# Patient Record
Sex: Male | Born: 1955 | Race: White | Hispanic: No | Marital: Married
Health system: Southern US, Community
[De-identification: ages and names within clinical notes are randomized; demographics above are authoritative.]

---

## 1998-09-17 ENCOUNTER — Emergency Department (HOSPITAL_COMMUNITY): Admission: EM | Admit: 1998-09-17 | Discharge: 1998-09-17 | Payer: Self-pay | Admitting: Emergency Medicine

## 2000-10-09 ENCOUNTER — Emergency Department (HOSPITAL_COMMUNITY): Admission: EM | Admit: 2000-10-09 | Discharge: 2000-10-09 | Payer: Self-pay | Admitting: Emergency Medicine

## 2006-08-25 ENCOUNTER — Ambulatory Visit: Payer: Self-pay | Admitting: Internal Medicine

## 2007-06-24 DIAGNOSIS — F411 Generalized anxiety disorder: Secondary | ICD-10-CM | POA: Insufficient documentation

## 2007-06-24 DIAGNOSIS — K625 Hemorrhage of anus and rectum: Secondary | ICD-10-CM | POA: Insufficient documentation

## 2007-06-24 DIAGNOSIS — I10 Essential (primary) hypertension: Secondary | ICD-10-CM | POA: Insufficient documentation

## 2007-06-24 DIAGNOSIS — K219 Gastro-esophageal reflux disease without esophagitis: Secondary | ICD-10-CM | POA: Insufficient documentation

## 2007-07-26 ENCOUNTER — Emergency Department (HOSPITAL_COMMUNITY): Admission: EM | Admit: 2007-07-26 | Discharge: 2007-07-26 | Payer: Self-pay | Admitting: Emergency Medicine

## 2007-07-27 ENCOUNTER — Emergency Department (HOSPITAL_COMMUNITY): Admission: EM | Admit: 2007-07-27 | Discharge: 2007-07-27 | Payer: Self-pay | Admitting: Emergency Medicine

## 2007-08-13 ENCOUNTER — Emergency Department (HOSPITAL_COMMUNITY): Admission: EM | Admit: 2007-08-13 | Discharge: 2007-08-14 | Payer: Self-pay | Admitting: Emergency Medicine

## 2010-07-07 NOTE — Assessment & Plan Note (Signed)
[facility]                         GASTROENTEROLOGY OFFICE NOTE   NAME:Brendan Herrera, Brendan Herrera                     MRN:          [id number]  DATE:08/25/2006                            DOB:          09-02-1955    CHIEF COMPLAINT:  Heme positive stool.   REQUESTING PHYSICIAN:  Mila Homer   ASSESSMENT:  1. This is a 55 year old white man with occasional rectal bleeding      that sounds like it could be anorectal. He also has heme positive      stool. The possibility of colorectal neoplasia exists and I      specifically informed the patient that colon cancer could cause      these problems.  2. Excessive alcohol use, question alcoholism.  3. Some recent heartburn and indigestion question related to alcohol,      aspirin, other problems, or just gastroesophageal reflux disease.      He is on Tums, but does not wish to have other treatment.   RECOMMENDATIONS AND PLAN:  1. I recommended a colonoscopy as Dr. Perrin Maltese has. The patient was      originally set up for a direct schedule, but he wanted to meet me      first. He still wants to think about this and he understands the      possibility that he could be harboring a colon cancer and that      diagnosing it sooner rather than later would be beneficial.  2. I cautioned him about his alcohol intake. I think that he does      drink too much on a regular basis and this puts him at risk of      liver disease. He is not inclined to curtail his alcohol at this      time.  3. I also thought that an upper GI endoscopy could be useful to      evaluate his symptoms and his heme positive, but he declined this      as well.   Please see the medical history and physical form for further details  about this patient's visit today. Physical examination was unrevealing  overall. There was no stigmata of chronic liver disease noted.   I have reviewed the office notes from Dr. Perrin Maltese.   Note that his other problems  and conditions include: Anxiety and panic  disorder; hypertension. He had a traumatic injury to the right upper  extremity as a child with fracture and surgical repair. He has had  spastic torticollis problems.     Iva Boop, MD,FACG  Electronically Signed   CEG/MedQ  DD: 08/25/2006  DT: 08/26/2006  Job #: [id number]   cc:   Jonita Albee, M.D.

## 2010-11-19 LAB — POCT I-STAT, CHEM 8
Calcium, Ion: 1.12
Creatinine, Ser: 1
Hemoglobin: 17.3 — ABNORMAL HIGH
Sodium: 137
TCO2: 26

## 2010-11-19 LAB — DIFFERENTIAL
Basophils Absolute: 0.1
Basophils Absolute: 0
Basophils Relative: 1
Basophils Relative: 0
Eosinophils Absolute: 0.3
Eosinophils Absolute: 0.3
Eosinophils Relative: 3
Eosinophils Relative: 4
Eosinophils Relative: 3
Lymphocytes Relative: 15
Lymphocytes Relative: 20
Lymphocytes Relative: 12
Lymphs Abs: 1.4
Lymphs Abs: 1.5
Lymphs Abs: 1.3
Monocytes Absolute: 0.6
Monocytes Absolute: 0.7
Monocytes Absolute: 0.7
Monocytes Relative: 9
Monocytes Relative: 7
Neutro Abs: 5
Neutro Abs: 6.9
Neutro Abs: 8.4 — ABNORMAL HIGH
Neutrophils Relative %: 66
Neutrophils Relative %: 78 — ABNORMAL HIGH

## 2010-11-19 LAB — RAPID URINE DRUG SCREEN, HOSP PERFORMED
Amphetamines: NOT DETECTED
Barbiturates: NOT DETECTED
Benzodiazepines: NOT DETECTED
Cocaine: NOT DETECTED
Opiates: NOT DETECTED
Opiates: NOT DETECTED
Tetrahydrocannabinol: NOT DETECTED
Tetrahydrocannabinol: NOT DETECTED

## 2010-11-19 LAB — CBC
HCT: 46.9
HCT: 48.6
HCT: 45.7
Hemoglobin: 16.1
Hemoglobin: 16.5
Hemoglobin: 15.5
MCHC: 34.3
MCHC: 33.9
MCV: 93
MCV: 93.2
Platelets: 322
Platelets: 306
RBC: 5.05
RBC: 5.23
RBC: 4.9
RDW: 12.7
RDW: 12.8
WBC: 7.5
WBC: 9.2
WBC: 10.7 — ABNORMAL HIGH

## 2010-11-19 LAB — ETHANOL
Alcohol, Ethyl (B): <5
Alcohol, Ethyl (B): <5
Alcohol, Ethyl (B): <5

## 2010-11-19 LAB — BASIC METABOLIC PANEL
GFR calc Af Amer: >60
GFR calc non Af Amer: >60
Potassium: 4.1
Sodium: 139

## 2012-07-14 ENCOUNTER — Ambulatory Visit: Payer: Self-pay | Admitting: Family Medicine

## 2013-08-06 ENCOUNTER — Inpatient Hospital Stay: Payer: Self-pay | Admitting: Internal Medicine

## 2013-08-06 DIAGNOSIS — I6350 Cerebral infarction due to unspecified occlusion or stenosis of unspecified cerebral artery: Secondary | ICD-10-CM

## 2013-08-06 DIAGNOSIS — A419 Sepsis, unspecified organism: Secondary | ICD-10-CM

## 2013-08-06 LAB — COMPREHENSIVE METABOLIC PANEL
ALBUMIN: 2.8 g/dL — AB (ref 3.4–5.0)
ALK PHOS: 79 U/L
ALT: 33 U/L (ref 12–78)
Anion Gap: 11 (ref 7–16)
BILIRUBIN TOTAL: 0.7 mg/dL (ref 0.2–1.0)
BUN: 9 mg/dL (ref 7–18)
CALCIUM: 8.7 mg/dL (ref 8.5–10.1)
CHLORIDE: 99 mmol/L (ref 98–107)
CREATININE: 0.77 mg/dL (ref 0.60–1.30)
Co2: 23 mmol/L (ref 21–32)
EGFR (Non-African Amer.): >60
Glucose: 129 mg/dL — ABNORMAL HIGH (ref 65–99)
OSMOLALITY: 267 (ref 275–301)
POTASSIUM: 4.2 mmol/L (ref 3.5–5.1)
SGOT(AST): 32 U/L (ref 15–37)
SODIUM: 133 mmol/L — AB (ref 136–145)
Total Protein: 7.1 g/dL (ref 6.4–8.2)

## 2013-08-06 LAB — CBC WITH DIFFERENTIAL/PLATELET
BASOS ABS: 0.1 10*3/uL (ref 0.0–0.1)
BASOS PCT: 0.4 %
EOS ABS: 0.1 10*3/uL (ref 0.0–0.7)
EOS PCT: 0.5 %
HCT: 38.4 % — ABNORMAL LOW (ref 40.0–52.0)
HGB: 12.7 g/dL — AB (ref 13.0–18.0)
Lymphocyte #: 0.7 10*3/uL — ABNORMAL LOW (ref 1.0–3.6)
Lymphocyte %: 3.3 %
MCH: 28.3 pg (ref 26.0–34.0)
MCHC: 33.2 g/dL (ref 32.0–36.0)
MCV: 85 fL (ref 80–100)
MONO ABS: 1.4 x10 3/mm — AB (ref 0.2–1.0)
Monocyte %: 6.8 %
NEUTROS ABS: 18.6 10*3/uL — AB (ref 1.4–6.5)
Neutrophil %: 89 %
Platelet: 359 10*3/uL (ref 150–440)
RBC: 4.51 10*6/uL (ref 4.40–5.90)
RDW: 14.4 % (ref 11.5–14.5)
WBC: 20.9 10*3/uL — AB (ref 3.8–10.6)

## 2013-08-06 LAB — TROPONIN I
TROPONIN-I: 0.06 ng/mL — AB
TROPONIN-I: 0.05 ng/mL
Troponin-I: 0.04 ng/mL

## 2013-08-06 LAB — URINALYSIS, COMPLETE
BACTERIA: NONE SEEN
BILIRUBIN, UR: NEGATIVE
Glucose,UR: NEGATIVE mg/dL (ref 0–75)
Ketone: NEGATIVE
NITRITE: NEGATIVE
Ph: 5 (ref 4.5–8.0)
RBC,UR: 3 /HPF (ref 0–5)
Specific Gravity: 1.01 (ref 1.003–1.030)
Squamous Epithelial: 1
WBC UR: 53 /HPF (ref 0–5)

## 2013-08-06 LAB — CK TOTAL AND CKMB (NOT AT ARMC)
CK, TOTAL: 24 U/L — AB
CK, TOTAL: 21 U/L — AB
CK, Total: 96 U/L
CK-MB: <0.5 ng/mL — ABNORMAL LOW (ref 0.5–3.6)
CK-MB: <0.5 ng/mL — ABNORMAL LOW (ref 0.5–3.6)

## 2013-08-07 ENCOUNTER — Ambulatory Visit: Payer: Self-pay | Admitting: Neurology

## 2013-08-07 DIAGNOSIS — I059 Rheumatic mitral valve disease, unspecified: Secondary | ICD-10-CM

## 2013-08-07 LAB — LIPID PANEL
CHOLESTEROL: 90 mg/dL (ref 0–200)
HDL Cholesterol: 26 mg/dL — ABNORMAL LOW (ref 40–60)
Ldl Cholesterol, Calc: 53 mg/dL (ref 0–100)
Triglycerides: 53 mg/dL (ref 0–200)
VLDL CHOLESTEROL, CALC: 11 mg/dL (ref 5–40)

## 2013-08-07 LAB — MAGNESIUM: Magnesium: 2 mg/dL

## 2013-08-07 LAB — TROPONIN I: TROPONIN-I: 0.02 ng/mL

## 2013-08-07 LAB — CK-MB

## 2013-08-08 DIAGNOSIS — A419 Sepsis, unspecified organism: Secondary | ICD-10-CM

## 2013-08-08 DIAGNOSIS — I6340 Cerebral infarction due to embolism of unspecified cerebral artery: Secondary | ICD-10-CM

## 2013-08-08 LAB — CBC WITH DIFFERENTIAL/PLATELET
BASOS PCT: 0.6 %
Basophil #: 0.1 10*3/uL (ref 0.0–0.1)
EOS ABS: 0.2 10*3/uL (ref 0.0–0.7)
Eosinophil %: 1.4 %
HCT: 34.2 % — AB (ref 40.0–52.0)
HGB: 11.1 g/dL — ABNORMAL LOW (ref 13.0–18.0)
Lymphocyte #: 1.4 10*3/uL (ref 1.0–3.6)
Lymphocyte %: 9.6 %
MCH: 27.9 pg (ref 26.0–34.0)
MCHC: 32.5 g/dL (ref 32.0–36.0)
MCV: 86 fL (ref 80–100)
MONO ABS: 1.3 x10 3/mm — AB (ref 0.2–1.0)
MONOS PCT: 9.1 %
Neutrophil #: 11.3 10*3/uL — ABNORMAL HIGH (ref 1.4–6.5)
Neutrophil %: 79.3 %
Platelet: 356 10*3/uL (ref 150–440)
RBC: 3.98 10*6/uL — AB (ref 4.40–5.90)
RDW: 14.2 % (ref 11.5–14.5)
WBC: 14.3 10*3/uL — AB (ref 3.8–10.6)

## 2013-08-08 LAB — CK-MB: CK-MB: <0.5 ng/mL — ABNORMAL LOW (ref 0.5–3.6)

## 2013-08-08 LAB — COMPREHENSIVE METABOLIC PANEL
ALT: 27 U/L (ref 12–78)
AST: 20 U/L (ref 15–37)
Albumin: 2.3 g/dL — ABNORMAL LOW (ref 3.4–5.0)
Alkaline Phosphatase: 65 U/L
Anion Gap: 8 (ref 7–16)
BUN: 6 mg/dL — AB (ref 7–18)
Bilirubin,Total: 0.4 mg/dL (ref 0.2–1.0)
CALCIUM: 7.9 mg/dL — AB (ref 8.5–10.1)
CHLORIDE: 106 mmol/L (ref 98–107)
CO2: 25 mmol/L (ref 21–32)
CREATININE: 0.83 mg/dL (ref 0.60–1.30)
Glucose: 94 mg/dL (ref 65–99)
Osmolality: 275 (ref 275–301)
POTASSIUM: 3.4 mmol/L — AB (ref 3.5–5.1)
Sodium: 139 mmol/L (ref 136–145)
TOTAL PROTEIN: 6.2 g/dL — AB (ref 6.4–8.2)

## 2013-08-08 LAB — TROPONIN I: Troponin-I: <0.02 ng/mL

## 2013-08-08 LAB — PSA: PSA: 2.3 ng/mL (ref 0.0–4.0)

## 2013-08-08 LAB — VANCOMYCIN, TROUGH: Vancomycin, Trough: 13 ug/mL (ref 10–20)

## 2013-08-09 DIAGNOSIS — I059 Rheumatic mitral valve disease, unspecified: Secondary | ICD-10-CM

## 2013-08-09 LAB — LIPID PANEL
CHOLESTEROL: 78 mg/dL (ref 0–200)
HDL: 23 mg/dL — AB (ref 40–60)
LDL CHOLESTEROL, CALC: 42 mg/dL (ref 0–100)
TRIGLYCERIDES: 66 mg/dL (ref 0–200)
VLDL CHOLESTEROL, CALC: 13 mg/dL (ref 5–40)

## 2013-08-09 LAB — VANCOMYCIN, TROUGH: VANCOMYCIN, TROUGH: 22 ug/mL — AB (ref 10–20)

## 2013-08-10 LAB — GENTAMICIN LEVEL, TROUGH: Gentamicin, Trough: 0.9 ug/mL (ref 0.0–2.0)

## 2013-08-10 LAB — GENTAMICIN LEVEL, PEAK: Gentamicin, Peak: 2.3 ug/mL — ABNORMAL LOW (ref 4.0–8.0)

## 2013-08-11 LAB — GENTAMICIN LEVEL, TROUGH: GENTAMICIN, TROUGH: 0.8 ug/mL (ref 0.0–2.0)

## 2013-08-11 LAB — CBC WITH DIFFERENTIAL/PLATELET
BASOS PCT: 0.6 %
Basophil #: 0.1 10*3/uL (ref 0.0–0.1)
Eosinophil #: 0.4 10*3/uL (ref 0.0–0.7)
Eosinophil %: 3.7 %
HCT: 32.8 % — ABNORMAL LOW (ref 40.0–52.0)
HGB: 10.6 g/dL — ABNORMAL LOW (ref 13.0–18.0)
Lymphocyte #: 1.4 10*3/uL (ref 1.0–3.6)
Lymphocyte %: 11.2 %
MCH: 27.3 pg (ref 26.0–34.0)
MCHC: 32.1 g/dL (ref 32.0–36.0)
MCV: 85 fL (ref 80–100)
MONO ABS: 0.9 x10 3/mm (ref 0.2–1.0)
MONOS PCT: 7.4 %
NEUTROS ABS: 9.4 10*3/uL — AB (ref 1.4–6.5)
NEUTROS PCT: 77.1 %
PLATELETS: 431 10*3/uL (ref 150–440)
RBC: 3.86 10*6/uL — AB (ref 4.40–5.90)
RDW: 14.2 % (ref 11.5–14.5)
WBC: 12.1 10*3/uL — ABNORMAL HIGH (ref 3.8–10.6)

## 2013-08-11 LAB — CULTURE, BLOOD (SINGLE)

## 2013-08-11 LAB — BASIC METABOLIC PANEL
Anion Gap: 7 (ref 7–16)
BUN: 7 mg/dL (ref 7–18)
CALCIUM: 8.5 mg/dL (ref 8.5–10.1)
CHLORIDE: 105 mmol/L (ref 98–107)
CO2: 26 mmol/L (ref 21–32)
CREATININE: 0.81 mg/dL (ref 0.60–1.30)
EGFR (Non-African Amer.): >60
Glucose: 104 mg/dL — ABNORMAL HIGH (ref 65–99)
OSMOLALITY: 274 (ref 275–301)
POTASSIUM: 3.8 mmol/L (ref 3.5–5.1)
Sodium: 138 mmol/L (ref 136–145)

## 2013-08-11 LAB — URINE CULTURE

## 2013-08-11 LAB — GENTAMICIN LEVEL, PEAK: Gentamicin, Peak: 4.1 ug/mL (ref 4.0–8.0)

## 2013-08-11 LAB — CLOSTRIDIUM DIFFICILE(ARMC)

## 2013-08-12 LAB — CREATININE, SERUM: Creatinine: 0.74 mg/dL (ref 0.60–1.30)

## 2013-08-14 LAB — CULTURE, BLOOD (SINGLE)

## 2013-09-06 ENCOUNTER — Ambulatory Visit: Payer: BC Managed Care – PPO | Admitting: Cardiovascular Disease

## 2014-06-15 NOTE — Consult Note (Signed)
General Aspect PRIMARY CARE PHYSICIAN: Barnie Del, nurse practitioner at [facility].   Reason for consult: evaluate for endocarditis.   CHIEF COMPLAINT: High-grade fever, body aches, and generalized weakness for the last several days.   HISTORY OF PRESENT ILLNESS: Brendan Herrera is a very pleasant 59 year old Caucasian gentleman with past medical history of hypertension, history of spasmodic torticollis, comes to the Emergency Room after he started having high-grade fevers which began June 4 with 101.4. The patient has been having several spikes of fever anywhere from 100, more than 100.2, for the last several days, and his temperature in the Emergency Room was 103 prior to arrival. His white count was elevated at 20,000. The patient says he has been having low-grade fever, body aches, not feeling well, some nonspecific rash that comes and goes for the last 1 year prior to June 4. He also had an episode where he removed a tick from the scrotum. he denies chest pain. He does complain of mild dyspnea but no orthopnea or PND. He is not aware of any previous cardiac history.   Present Illness In the Emergency Room, the patient reported difficulty seeing on the right side peripheral field of vision, since yesterday. CT of the head was done, which shows a left occipital lobe infarct with the PCA artery territory involvement. No other focal deficits noted as far as focal weakness on his arms or legs.  the patient had blood cultures drawn. He was started on antibiotics.  PAST MEDICAL HISTORY: 1.  Hypertension.  2.  Spasmodic  torticollis.   PAST SURGICAL HISTORY: Colon surgery with colostomy, reversal of it, secondary to a small bowel obstruction reason/small bowel obstruction unknown.   FAMILY HISTORY: Mother had diabetes and hypertension. Father died of carcinoma of the bladder. He had CVA.   MEDICATIONS: 1.  Klonopin 0.5 mg p.o. daily.  2.  Amlodipine 5 mg daily.  3.  MVI daily.  4.  Aspirin  81 mg daily.  5.  Cialis 5 mg daily.   SOCIAL HISTORY: Smokes on a daily basis. less than a pack a day. He works in Radiation protection practitioner.   Physical Exam:  GEN no acute distress   NECK supple   RESP normal resp effort  clear BS   CARD Regular rate and rhythm  Tachycardic  No murmur   ABD denies tenderness   LYMPH negative neck, negative axillae   EXTR negative cyanosis/clubbing, negative edema   SKIN normal to palpation   NEURO cranial nerves intact   PSYCH alert, A+O to time, place, person   Review of Systems:  Subjective/Chief Complaint high-grade fever   General: Fatigue  Fever/chills  Weakness   Skin: Rashes   ENT: No Complaints   Eyes: No Complaints   Neck: No Complaints   Respiratory: Short of breath   Cardiovascular: Dyspnea   Gastrointestinal: No Complaints   Genitourinary: No Complaints   Vascular: No Complaints   Musculoskeletal: No Complaints   Hematologic: No Complaints   Endocrine: No Complaints   Psychiatric: No Complaints   Review of Systems: All other systems were reviewed and found to be negative   Lab Results: Hepatic:  15-Jun-15 12:09   Bilirubin, Total 0.7  Alkaline Phosphatase 79 (45-117 NOTE: New Reference Range 01/12/13)  SGPT (ALT) 33  SGOT (AST) 32  Total Protein, Serum 7.1  Albumin, Serum  2.8  Routine Chem:  15-Jun-15 12:09   Result Comment Total CK - Slight hemolysis, interpret results with  - caution.  Result(s) reported on 06 Aug 2013 at 03:28PM.  Result Comment Troponin - RESULTS VERIFIED BY REPEAT TESTING.  - Elevated troponin called to and read  - back by Misty Green in ER @ 1525  - 08/06/13 by BGB.  Result(s) reported on 06 Aug 2013 at 03:28PM.  Result Comment POTASSIUM / AST - Slight hemolysis, interpret results with  - caution.  Result(s) reported on 06 Aug 2013 at 12:32PM.  Glucose, Serum  129  BUN 9  Creatinine (comp) 0.77  Sodium, Serum  133  Potassium, Serum 4.2  Chloride, Serum  99  CO2, Serum 23  Calcium (Total), Serum 8.7  Osmolality (calc) 267  eGFR (African American) >60  eGFR (Non-African American) >60 (eGFR values <60mL/min/1.73 m2 may be an indication of chronic kidney disease (CKD). Calculated eGFR is useful in patients with stable renal function. The eGFR calculation will not be reliable in acutely ill patients when serum creatinine is changing rapidly. It is not useful in  patients on dialysis. The eGFR calculation may not be applicable to patients at the low and high extremes of body sizes, pregnant women, and vegetarians.)  Anion Gap 11  Cardiac:  15-Jun-15 12:09   CK, Total 96 (39-308 NOTE: NEW REFERENCE RANGE  03/26/2013)  CPK-MB, Serum  < 0.5  Troponin I  0.06 (0.00-0.05 0.05 ng/mL or less: NEGATIVE  Repeat testing in 3-6 hrs  if clinically indicated. >0.05 ng/mL: POTENTIAL  MYOCARDIAL INJURY. Repeat  testing in 3-6 hrs if  clinically indicated. NOTE: An increase or decrease  of 30% or more on serial  testing suggests a  clinically important change)  Routine UA:  15-Jun-15 12:09   Color (UA) Yellow  Clarity (UA) Hazy  Glucose (UA) Negative  Bilirubin (UA) Negative  Ketones (UA) Negative  Specific Gravity (UA) 1.010  Blood (UA) 1+  pH (UA) 5.0  Protein (UA) 30 mg/dL  Nitrite (UA) Negative  Leukocyte Esterase (UA) 2+ (Result(s) reported on 06 Aug 2013 at 12:37PM.)  RBC (UA) 3 /HPF  WBC (UA) 53 /HPF  Bacteria (UA) NONE SEEN  Epithelial Cells (UA) 1 /HPF  Mucous (UA) PRESENT  Amorphous Crystal (UA) PRESENT (Result(s) reported on 06 Aug 2013 at 12:37PM.)  Routine Hem:  15-Jun-15 12:09   WBC (CBC)  20.9  RBC (CBC) 4.51  Hemoglobin (CBC)  12.7  Hematocrit (CBC)  38.4  Platelet Count (CBC) 359  MCV 85  MCH 28.3  MCHC 33.2  RDW 14.4  Neutrophil % 89.0  Lymphocyte % 3.3  Monocyte % 6.8  Eosinophil % 0.5  Basophil % 0.4  Neutrophil #  18.6  Lymphocyte #  0.7  Monocyte #  1.4  Eosinophil # 0.1  Basophil # 0.1  (Result(s) reported on 06 Aug 2013 at 12:32PM.)   EKG:  EKG Nml  nml St/T  sinus tachycardia   Radiology Results: XRay:    15-Jun-15 14:33, Chest PA and Lateral  Chest PA and Lateral   REASON FOR EXAM:    Fever  COMMENTS:       PROCEDURE: DXR - DXR CHEST PA (OR AP) AND LATERAL  - Aug 06 2013  2:33PM     CLINICAL DATA:  Fever.    EXAM:  CHEST  2 VIEW    COMPARISON:  None.    FINDINGS:  Lungs are hyperexpanded, suggesting emphysema. Interstitial markings  are diffusely coarsened with chronic features. No edema or focal  airspace consolidation. No pleural effusion. The cardiopericardial  silhouette is within normal limits for size.   Imaged bony structures  of the thorax are intact.    IMPRESSION:  Hyperexpansion without acute cardiopulmonary findings.      Electronically Signed    By: Misty Stanley M.D.    On: 08/06/2013 14:59         Verified By: ERIC A. MANSELL, M.D.,  CT:    15-Jun-15 13:47, CT Head Without Contrast  CT Head Without Contrast   REASON FOR EXAM:    Visual changes, fever  COMMENTS:       PROCEDURE: CT  - CT HEAD WITHOUT CONTRAST  - Aug 06 2013  1:47PM     CLINICAL DATA:  Intermittent fever with generalized body aches and  fatigue. Visual changes.    EXAM:  CT HEAD WITHOUT CONTRAST    TECHNIQUE:  Contiguous axial images were obtained from the base of the skull  through the vertex without intravenous contrast.  COMPARISON:  None.    FINDINGS:  There is no evidence for acute hemorrhage, hydrocephalus, mass  lesion, or abnormal extra-axial fluid collection. Hypoattenuation in  the left medial occipital lobe it is compatible with subacute  ischemia. The visualized paranasal sinuses and mastoid air cells are  clear.     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 No evidence for associated hemorrhage.    I personally called these findings to Dr. Kerman Passey at  approximately 1420 hrs on  08/06/2013.  Electronically Signed    By: Misty Stanley M.D.    On: 08/06/2013 14:24         Verified By: ERIC A. MANSELL, M.D.,    Propranolol: Other  Vital Signs/Nurse's Notes: **Vital Signs.:   15-Jun-15 17:02  Vital Signs Type Admission  Temperature Temperature (F) 102.7  Celsius 39.2  Pulse Pulse 118  Respirations Respirations 20  Systolic BP Systolic BP 557  Diastolic BP (mmHg) Diastolic BP (mmHg) 68  Mean BP 89  Pulse Ox % Pulse Ox % 99  Pulse Ox Activity Level  At rest  Oxygen Delivery Room Air/ 21 %    Impression 1. Sepsis: I agree that infective endocarditis is in the differential diagnosis. I don't hear cardiac murmurs by exam. Blood cultures were drawn. I agree with broad-spectrum antibiotics. An echocardiogram was ordered. A transesophageal echocardiogram can be considered based on overall course.  2. Subacute stroke: This could be embolic in origin and raises suspicion for endocarditis. Review echocardiogram.   Electronic Signatures: Kathlyn Sacramento (MD)  (Signed 15-Jun-15 19:19)  Authored: General Aspect/Present Illness, History and Physical Exam, Review of System, Labs, EKG , Radiology, Allergies, Vital Signs/Nurse's Notes, Impression/Plan   Last Updated: 15-Jun-15 19:19 by Kathlyn Sacramento (MD)

## 2014-06-15 NOTE — Discharge Summary (Signed)
PATIENT NAME:  Brendan Herrera, Brendan Herrera MR#:  [id number] DATE OF BIRTH:  May 01, 1955  DATE OF ADMISSION:  08/06/2013 DATE OF DISCHARGE:  08/12/2013  ADMITTING DIAGNOSIS: Suspected endocarditis.   DISCHARGE DIAGNOSES: 1.  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  2.  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  3.  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  4.  Aerococcus urinary tract infection versus aerococcus bacteremia related bacteriuria.  5.  Neuropathy.  6.  Adjustment reaction.  7.  Urinary retention, resolved.  8.  History of hypertension, periodic spasmotic torticolis   DISCHARGE CONDITION: Stable.   DISCHARGE MEDICATIONS: The patient is to continue his outpatient medications, which are Cialis 5 mg p.o. daily, clonazepam 0.5 mg p.o. daily, aspirin 81 mg p.o. daily, multivitamins once daily, ibuprofen 400 mg every six hours as needed, Tylenol 325 mg 2 tablets every four hours as needed, tamsulosin 0.4 mg p.o. daily, gabapentin 100 mg p.o. at bedtime, sertraline 25 mg p.o. at bedtime, Ensure 240 mg twice daily, gentamicin 8 mg every eight hours IV via PICC line, Lipitor 10 mg p.o. at bedtime, penicillin G injections 4 million units every four hours, which constitutes 24 million units in 24 hours. The patient is not to take amlodipine until recommended by primary care physician.   PROCEDURES: PICC line placement on 08/11/2013.   DISCHARGE INSTRUCTIONS: Home health, physical therapy as well as nurse. PICC line care per protocol. Home oxygen: None.   DIET: 2 grams salt, low fat, low cholesterol, regular consistency.   ACTIVITY LIMITATIONS: As tolerated. Followup appointment with Dr. Sampson Goon in two days after discharge.   CONSULTANTS: Care  management, social work, Dr. Loretha Brasil of neurology, Dr. Kirke Corin, cardiology, Dr. Mariah Milling cardiology, Dr. Sampson Goon, infectious disease.   RADIOLOGIC STUDIES: Chest x-ray, PA and lateral, 08/06/2013 revealed hyperexpansion without acute cardiopulmonary disease. A CT scan of head without contrast, 08/07/2013, revealed subacute left occipital lobe infarct, consistent with PCA territory involvement. No evidence of associated hemorrhage. MRI of brain without contrast, 08/07/2013, revealed a confluent left PCA acute infarct with petechial hemorrhage and cytotoxic edema but no significant mass effect, small acute infarcts in the bilateral MCA territories and left cerebellar hemisphere, raising the possibility of recent embolic event. Synchronuous small vessel ischemia is felt to be less likely. MRI of brain without contrast, 08/06/2013, showed poor flow or occlusion in the left PCA, superior T3 branch. Otherwise, negative posterior circulation, negative anterior circulation. Echocardiogram, 08/07/2013, revealing challenging image quality; unable to exclude endocarditis, thickened mitral valve leaflets, particularly the anterior leaflets. Consider TEE if clinically indicated. Left ventricular ejection fraction by visual estimation was 60% to 65%, normal global left ventricular systolic function, impaired  relaxation pattern of left ventricular diastolic filling, normal right ventricular size and systolic function, mild mitral valve regurgitation, normal right ventricular systolic pressures. TEE done on 08/08/2013 is reported by Dr. Mariah Milling, revealing images suggesting endocarditis of mitral valve. Large global opacity was noted. Also, possible minimal sized vegetation noted on the aortic valve. No significant valvular regurgitation was noted. Normal left ventricular and right ventricular size and function, and no interatrial shunt. Bubble was negative. Unable to Valsalva. Doppler was negative. Carotid ultrasound,  08/07/2013: Very minimal amount of bilateral sclerotic plaque, right subjectively greater than left, not resulting in hemodynamically significant stenosis, according to the radiologist. CT scan of  abdomen and pelvis without contrast, 08/09/2013, showed no findings in the abdomen or pelvis, no nephrolithiasis or obstructive uropathy identified. Mild bilateral perinephric fat stranding, which is a nonspecific finding and may be seen in pyelonephritis as well as chronic renal insufficiency. Portable single-view chest x-ray, 08/11/2013, after PICC line placement, revealed left PICC line with tip in good position in the distal SVC.   HISTORY AND HOSPITAL COURSE: The patient is a 59 year old Caucasian male with a past medical history significant for history of spasmodic torticollis, who presents to the hospital with complaints of high-grade fever, body aches, generalized weakness for the past couple of days. Please refer to Dr. Jearl KlinefelterSona Patel's admission note on 08/06/2013. On arrival to the hospital, the patient's temperature was 99.7, pulse was 112, respiratory rate was 18 to 20, blood pressure 129/76, saturation was 97% on room air. Physical exam revealed tachycardia. No murmur, otherwise unremarkable exam.   The patient's lab data, done in the Emergency Room on the day of admission, showed a low sodium level at 133, glucose 129, otherwise BMP was unremarkable. The patient's albumin level was 2.8. Troponin was mildly elevated at 0.06 on the first set, normal at 0.04 on the second set, and normal on the third as well as on the fourth sets. The patient's CBC: White blood cell count was elevated to 20.9, hemoglobin was 12.7, platelet count was 359. Absolute neutrophil count was 18.6. Blood cultures taken on 08/06/2013 did not show any growth. Urinalysis was remarkable for 30 mg/dL protein, 2+ leukocyte esterase, 3 red blood cells, 53 white blood cells. No bacteria was seen, 1 epithelial cell, as well as mucus and  amorphous crystals were present. Urine cultures grew more than 100,000 colony-forming units aerococcus urinae , sensitive to erythromycin, vancomycin, as well as penicillin. The sensitivity MIC of penicillin was 0.94.   The patient was admitted to the hospital for further evaluation. He was started on broad-spectrum antibiotic therapy due to concern of endocarditis, and cardiology consultation was obtained. The patient was seen by Dr. Kirke CorinArida and underwent echocardiogram, which showed questionable mitral valve endocarditis. The patient underwent, eventually, a TEE, which was concerning for endocarditis and suspected septic embolic phenomenon. He was evaluated by a neurologist, Dr. Loretha BrasilZeylikman, who advised the patient about his disability and extent of his neurologic disease. It was felt that the patient's stroke was very likely due to endocarditis, embolic stroke. Cardiologist as well as neurologist did not feel that any other anticoagulation except for aspirin will be indicated for this patient.   The patient underwent an evaluation of his lipid profile while he was in the hospital, and LDL was found to be 53. Cholesterol level was 90, triglycerides were 53 and HDL was 26. He was initiated on Lipitor due to the presence of stroke. However, it is recommended to follow the patient's lipid panel and if his LDL drops down significantly below 50, it is recommended to discontinue Lipitor completely.   In regard to endocarditis, as mentioned above, the patient was noted to have endocarditis changes on mitral valve as well as possibly aortic valve during echocardiogram as well as especially well seen on TEE. Patient was managed on broad-spectrum antibiotic therapy and consultation with Dr. Sampson GoonFitzgerald was obtained. It appeared that the patient's numerous urinary cultures were growing aerorococcus in his urine since April. It was felt that the patient's aerococcus could have been responsible for bacteremia, as, according  to Dr. Jacquenette ShoneFitzgerald,aerococcus urinae has a significant propensity to blood  dissemination and endocarditis, eventually  resulting in stroke. When sensitivities came back, the patient was switched to penicillin as well as gentamicin. The patient received a PICC line placement on 08/11/2013.   The patient is being discharged home. He is to start penicillin as well as gentamicin infusion, and home health services will be contacting him tomorrow, 08/13/2013, for further recommendations and therapy. The patient is to follow up with Dr. Sampson Goon in the next few days after discharge for management of his antibiotic infusion. In regard to enterococcus in urine, it was unclear if in fact if enterococcus in urine represented true urinary tract infection, versus it was related to a bacteremia. The patient had been receiving antibiotic therapy for now and antibiotic course will be determined by Dr. Sampson Goon.   The patient was complaining of right-sided neuropathic discomfort pain. He was initiated on Neurontin, which helped him in his symptomatology. It was recommended to advance the patient's Neurontin as needed, depending on his symptoms. The patient was also noted to be having some adjustment reaction, anxiety and concern about his work issues, as well as overall his life. It was felt that he was having adjustment reaction, and sertraline was initiated to help him to overcome his depressive symptoms.   The patient was noted to have, also, an episode of urinary retention while in the hospital, when he first came in to the hospital, with significant urinary retention requiring intermittent catheterization. The patient was initiated on Flomax, which helped him with retention symptoms.    In regards to hypertension, the patient's amlodipine was stopped for the time being due to relative hypotension while he was in the hospital. It is recommended to restart Norvasc depending on his blood pressure measurements. The  patient is being discharged in stable condition with the above-mentioned medications and followup. On the day of discharge, 08/12/2013, his vitals were stable, with temperature of 99, ranging from 98.6 to 99.5. Pulse is around 100 to 110, respiratory rate 18, blood pressure 136/71, saturation 95% to 97% on room air at rest.   TIME SPENT: 40 minutes.   ____________________________ Katharina Caper, MD rv:cg D: 08/12/2013 19:02:00 ET T: 08/13/2013 06:49:20 ET JOB#: [id number]  cc: Stann Mainland. Sampson Goon, MD Katharina Caper, MD, <Dictator>   Shai Rasmussen MD ELECTRONICALLY SIGNED 08/18/2013 19:55

## 2014-06-15 NOTE — Consult Note (Signed)
PATIENT NAME:  Brendan Herrera, Cephas T MR#:  [id number] DATE OF BIRTH:  1955-10-06  DATE OF CONSULTATION:  08/07/2013  CONSULTING PHYSICIAN:  Stann Mainlandavid P. Sampson GoonFitzgerald, MD  REQUESTING PHYSICIAN: Dr. Katharina Caperima Vaickute   REASON FOR CONSULT: Sepsis, fever, and stroke.   HISTORY OF PRESENT ILLNESS: This is a very pleasant, previously healthy, 59 year old gentleman with a history of hypertension and spasmodic torticollis, who reports starting to feel ill on his birthday, [birth year]. He initially developed chills, and then proceeded to have fevers. He felt quite ill. Prior to that, he had been in his usual state of health, and been working. He says perhaps he had been feeling a little ill for some time prior. He reports that over the last several weeks he  has been "fighting a urinary tract infection" and his primary care doctor has been giving him some antibiotics. He is not sure what those are. He also reported a tick bite 2 weeks prior to starting to feel ill. He was seen in the Emergency Room, where he was also noted to have some vision field loss, and had a CT scan that showed an occipital stroke. Since that time, he has been admitted and treated with broad-spectrum antibiotics. Workup is pending, with an echocardiogram and blood cultures. He continues to remain quite diaphoretic and have fevers subjectively. He has had some mild headache as well. He reports no sore throat, cough, chest pain, nausea, vomiting, or diarrhea. He has had the urinary symptoms as above. He reports some arthralgias in his hands and feet, but not in his major joints. He does not describe any rash, except for mosquito bites.   The patient works in Armed forces technical officerelectronics maintenance. He has no significant exposures at work. He has 2 cats and 2 dogs. He does drink 6 to 12 beers a day, and smokes a pack a day. He has not drunk in the last several days. He lives with his wife. He has grown children and a grandchild. He does not have any unusual hobbies or do  significant outdoor activities. He has had no travel in the last several years. Denies taking any other over-the-counter medications or illicit substances.   PAST MEDICAL HISTORY: Hypertension, spasmodic torticollis, erectile dysfunction.   PAST SURGICAL HISTORY: Surgery with colostomy secondary to small bowel obstruction.   FAMILY HISTORY: Positive for hypertension and diabetes in his mother, and carcinoma of the bladder in his father. Father also had history of  a CVA.    OUTPATIENT MEDICATIONS: Include Klonopin, amlodipine, multivitamin, aspirin, and Cialis. Current antibiotics include doxycycline, ceftriaxone, and vancomycin.   SOCIAL HISTORY: As above.   REVIEW OF SYSTEMS: Eleven systems were reviewed and are negative, except as per HPI.  PHYSICAL EXAMINATION: VITAL SIGNS: T-max since admission has been 102.7 and currently is 97.5, pulse 105, blood pressure 124/72, respirations 18, sat 97% on room air.  GENERAL: He is diaphoretic, somewhat lethargic, but interactive.  HEENT: Pupils are equal, round, reactive to light and accommodation. Sclerae are anicteric. No jaundice or lesions. Oropharynx is clear, but somewhat dry. He has no thrush or lesions. He does have poor dentition.  NECK: Supple. No anterior cervical, posterior cervical, or supraclavicular lymphadenopathy.  HEART: Regular, but distant heart sounds.  LUNGS: Clear.  ABDOMEN: Soft, nontender, nondistended. No hepatosplenomegaly is noted.   SKIN: He has no obvious rash or lesions.  EXTREMITIES: No clubbing, cyanosis, or edema.  NEUROLOGIC: He is alert and oriented x 3. He has a grossly nonfocal neuro exam, but does report  a field cut that he notices to the right.   DATA: Urinalysis on admission showed 53 white cells, 3 red cells. White blood count on admission was 20.9, hemoglobin 12.7, platelets 359, with a diff of 18.6 thousand neutrophils, 0.7 lymphocytes, 1.4 monocytes, 0.1 eosinophils. Hemoglobin 12.7, platelets 349.  Initial troponin was 0.06, follow up was 0.04. LFTs are normal, except albumin low at 2.8. Renal function shows creatinine of 0.77, BUN 9, sodium slightly low at 133. LDL was 53, and magnesium 2.0. Blood cultures x 2 are pending, as is urine culture.   IMAGING: The patient has had a chest x-ray which showed hyperexpansion without acute cardiopulmonary findings. Ultrasound Doppler of his carotids showed very minimal amount of bilateral arteriosclerotic plaque, right subjectively greater than left. MRI of the brain showed confluent left PCA acute infarct, with petechial hemorrhage and cytotoxic edema, but no significant mass effect. There are small acute infarcts in the bilateral MCA territories in the left cerebellar hemisphere. These raise the possibility of recent embolic events. Synchronous small vessel ischemia is felt less likely.   IMPRESSION: A 58 year old, previously relatively healthy gentleman, but he does drink quite heavily, and smoke. Admitted with 2 weeks of fevers, chills, and general decline. He also has embolic stroke at this point after a recent tick bite, but no other unusual exposures. He has not had any recent dental, urological or gastrointestinal procedures, but has been dealing with a urinary tract infection recently that is being treated as an outpatient.   Suspicion for endocarditis is high. However, tickborne illness is also a possibility. His urine sample is dirty, which would suggest possible resolving urinary infection. He could have become bacteremic from this, since he has had fevers  RECOMMENDATIONS: 1.  Continue vancomycin and doxycycline. However, I am going to switch his ceftriaxone to meropenem for broader coverage, given that he could have a urinary source.  2. I am checking an RPR and an ANA as cause of possible CVA in this relatively young gentleman.  3.  Will discuss further with him HIV testing.  4.  Echocardiogram does not show evidence of endocarditis. I would  hold at this point until we get further blood culture data before performing a TEE.  5.  Agree with Neurology consult.  6.  Tickborne serologies are pending.  7. We just tried to obtain records from his outpatient doctor regarding recent urinary symptoms, and treatment and cultures.   Thank you for the consult. I will be glad to follow up with you.    ____________________________ Stann Mainland. Sampson Goon, MD dpf:mr D: 08/07/2013 19:09:46 ET T: 08/07/2013 19:52:06 ET JOB#: [id number]  cc: Stann Mainland. Sampson Goon, MD, <Dictator> DAVID Sampson Goon MD ELECTRONICALLY SIGNED 08/16/2013 13:26

## 2014-06-15 NOTE — Consult Note (Signed)
PATIENT NAME:  Brendan Herrera, Brendan Herrera MR#:  [id number] DATE OF BIRTH:  November 06, 1955  DATE OF CONSULTATION:  08/07/2013  REFERRING PHYSICIAN:   CONSULTING PHYSICIAN:  Pauletta BrownsYuriy Zeylikman, MD  REASON FOR CONSULTATION: Stroke.  REPORT OF CONSULTATION: This is a [age] year old gentleman with past medical history of hypertension, history of spasmodic torticollis, comes into Emergency Department with high-grade fevers, since 07/26/2013 as high as 101.4. The patient was started on broad-spectrum antibiotics. No clear source of infection at this point right now. The patient did complain of right homonymous hemianopsia. The patient is status post imaging. A CAT scan that shows subacute left occipital lobe infarct with severe stenosis and left PCA that was confirmed by MRI and MRA. He was started on vancomycin and Rocephin.   PAST MEDICAL HISTORY: Hypertension, spasmodic torticollis.   PAST SURGICAL HISTORY: Includes colon cancer with colostomy status post reversal of a secondary small bowel obstruction.   HOME MEDICATIONS: Include Klonopin, amlodipine, multivitamin, aspirin 81, Cialis.   SOCIAL HISTORY: Smokes on daily basis, is less than a pack a day. 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   REVIEW OF SYSTEMS:  CONSTITUTIONAL:  Positive for fever, generalized fatigue. No cough. No wheezing. No chest pain. Positive for slight shortness of breath. No nausea. No vomiting.  HEENT:  Right homonymous hemianopsia. No tinnitus. No ear pain.  GENITOURINARY: No dysuria. No hematuria.  HEMATOLOGIC: No anemia or easy bruising.  MUSCULOSKELETAL:  No arthritis or cramping. No history of anxiety or depression.   VITAL SIGNS: Patient's temperature is 97.5, pulse 105, respirations 18, blood pressure 124/72, pulse oximetry 97%.   IMAGING DATA: Imaging has been reviewed as discussed above, MRI of the brain has bilateral multiple infarcts and multiple vascular distributions likely embolic in nature.   IMPRESSION: A  [age] year old gentleman with past medical history of hypertension and spasmodic torticollis who presents with generalized fevers, uncertain etiology at this point, status post blood cultures which are still pending, as well as urine cultures. On imaging, the patient was found to have left PCA stenosis with left PCA stroke that is causing right homonymous hemianopsia. Patient's neurological exam at this point, patient alert, awake, oriented to time, place, and location. Able to tell me the hospital, the reason why he is in the hospital, date, and time. Speech appears to be fluent. No signs of aphasia or dysarthria. No facial asymmetry. Tongue is midline. Uvula elevates symmetrically. Shoulder shrug intact. Motor strength appears to be 5/5 bilateral upper and lower extremities. Reflexes symmetrical 2+. Coordination intact. Sensation intact. Gait could not be assessed.   PLAN: I suspect that current stroke and other multiple bilateral infarcts that I see on MRI are probably embolic in nature. High possibility of endocarditis in this patient. TTE has been reviewed. He does have mitral valve thickening. No thrombus has been noted.  Case discussed with cardiology. If possible, patient will be made n.p.o. tonight for possibly TEE tomorrow. Please continue antibiotic, and depending on TEE, the patient might need long-term antibiotics. We will continue following cultures and get his case discussed with cardiology and patient was explained his current medical condition.  Thank you, it was a pleasure seeing this patient. Please call with any questions.    ____________________________ Pauletta BrownsYuriy Zeylikman, MD yz:dd D: 08/07/2013 18:48:44 ET Herrera: 08/07/2013 19:29:12 ET JOB#: [id number]  cc: Pauletta BrownsYuriy Zeylikman, MD, <Dictator> Pauletta BrownsYURIY ZEYLIKMAN MD ELECTRONICALLY SIGNED 08/09/2013 17:22

## 2014-06-15 NOTE — H&P (Signed)
PATIENT NAME:  Brendan Herrera, Brendan Herrera MR#:  [id number] DATE OF BIRTH:  1955-12-21  DATE OF ADMISSION:  08/06/2013  PRIMARY CARE PHYSICIAN: Clance Boll, nurse practitioner at [facility].   CHIEF COMPLAINT: High-grade fever, body aches, and generalized weakness for the last several days.   HISTORY OF PRESENT ILLNESS: Brendan Herrera is a very pleasant 59 year old Caucasian gentleman with past medical history of hypertension, history of spasmodic torticollis, comes to the Emergency Room after he started having high-grade fevers which began June 4 with 101.4. The patient has been having several spikes of fever anywhere from 100, more than 100.2, for the last several days, and his temperature in the Emergency Room was 103 prior to arrival. His white count was elevated at 20,000. The patient says he has been having low-grade fever, body aches, not feeling well, some nonspecific rash that comes and goes for the last 1 year prior to June 4. He also had an episode where he removed a tick from the scrotum. He does not know what kind of tick it was, and there were several areas on his body where it looks like he has bug bites/mosquito bites.   In the Emergency Room, the patient reported to the ER physician that he has been having difficulty seeing on the right side peripheral field of vision, since yesterday. CT of the head was done, which shows a left occipital lobe infarct with the PCA artery territory involvement. No other focal deficits noted as far as focal weakness on his arms or legs. The patient is being admitted for further evaluation and management. In the Emergency Room, he received IV vancomycin, IV Rocephin, and doxycycline to cover for tickborne illness versus possible endocarditis. The patient does not have any cardiac history, no history of any murmur either. The patient is thereby admitted with sepsis secondary to possible tickborne illness versus infective endocarditis versus possible UTI and/or viral  illness.   PAST MEDICAL HISTORY: 1.  Hypertension.  2.  Spasmodic  torticollis.   PAST SURGICAL HISTORY: Colon surgery with colostomy, reversal of it, secondary to a small bowel obstruction reason/small bowel obstruction unknown.   FAMILY HISTORY: Mother had diabetes and hypertension. Father died of carcinoma of the bladder. He had CVA.   MEDICATIONS: 1.  Klonopin 0.5 mg p.o. daily.  2.  Amlodipine 5 mg daily.  3.  MVI daily.  4.  Aspirin 81 mg daily.  5.  Cialis 5 mg daily.   SOCIAL HISTORY: Smokes on a daily basis. less than a pack a day. He works in Event organiser.   REVIEW OF SYSTEMS:    CONSTITUTIONAL: Positive for fatigue, weakness, fever.  EYES: Positive for loss of right peripheral field of vision. No cataracts or glaucoma.  EARS, NOSE, THROAT: No tinnitus, ear pain, snoring or discharge.  RESPIRATORY: No cough, wheeze, hemoptysis. Positive for COPD.  CARDIOVASCULAR: No chest pain, orthopnea, edema, dyspnea on exertion. Positive for hypertension.  GASTROINTESTINAL: No nausea, vomiting, diarrhea, abdominal pain. No hematemesis or GERD.  GENITOURINARY: No dysuria. No hematuria, renal calculus or frequency.  ENDOCRINE: No polyuria, nocturia or thyroid problems.  HEMATOLOGIC: No anemia, easy bruising or bleeding.  SKIN: No acne, rash. The patient does have some but bites on his lower extremity.  MUSCULOSKELETAL: No arthritis, cramps, swelling or gout.  NEUROLOGIC: No numbness, weakness dysarthria. Positive for right peripheral field of vision loss.  PSYCHIATRIC: No anxiety or depression.   All other systems reviewed and negative.   PHYSICAL EXAMINATION: GENERAL: The patient  is awake, alert, oriented x 3, not in acute distress.  VITAL SIGNS: He had temperature of 99.7. He earlier had fever of 102 to 103. Pulse is 112. Blood pressure is 129/76. Sats are 97% on room air.  HEENT: Atraumatic, normocephalic. Pupils: PERRLA. EOM intact. Oral mucosa is dry.  NECK:  Supple. No JVD. No carotid bruit.  LUNGS: Clear to auscultation bilaterally. No rales, rhonchi, respiratory distress or labored breathing.  HEART: Both the heart sounds are normal. Tachycardia present. No murmur heard. PMI not lateralized. Chest nontender.  EXTREMITIES: Good pedal pulses, good femoral pulses. No lower extremity edema.  ABDOMEN: Soft, benign, nontender. No organomegaly. Positive bowel sounds.  NEUROLOGIC: Grossly intact cranial nerves II through XII. No motor or sensory deficit.  PSYCHIATRIC: The patient is awake, alert, oriented x 3.  SKIN: No rash noted, other than some few bug bites on the lower extremity. There are no hemorrhagic lesions noted in the fingernails or the skin. No Osler nodes noted on the extremities.   LABORATORY AND RADIOLOGICAL DATA: Chest x-ray: Hyperexpansion without acute cardiopulmonary findings. CT of the head shows subacute left occipital lobe infarct consistent with PCA territory involvement. Urinalysis positive for UTI. White count is 20.9; hemoglobin and hematocrit are 12.7 and 38.4; platelet count is 59. Glucose is 129, BUN is 9, creatinine 0.77, sodium is 133, potassium 4.2. Albumin is 2.8. Troponin is 0.06. CK total is 96. EKG shows sinus tachycardia. No ST elevation or depression.   ASSESSMENT: A 59 year old Mr. Brendan Herrera with history of hypertension and spasmodic torticollis comes into the Emergency Room with:  1.  Sepsis: The patient presented with fever for about the last 10 to 14 days along with body aches, generalized weakness, and diarrhea. The patient is going to be admitted on the telemetry floor. Differential diagnosis includes tickborne illness versus urinary tract infection versus infective endocarditis versus viral illness. The patient is going to be admitted on the telemetry floor. Will continue empiric vancomycin, Rocephin, and doxycycline to cover the tickborne illness and possible infective endocarditis. Will follow up blood cultures and  urine cultures. Will get Transthoracic echocardiogram. The case was discussed with Dr. Kirke CorinArida, who will see the patient in consultation and recommends possibly will need TEE depending on clinical course. Follow up white count and urine cultures.  2.  Leukocytosis due to 1. 3.  Hypertension with relative hypotension: I will hold off on patient's blood pressure meds right now. Will give him IV fluids.  4.  Acute left occipital territory cerebrovascular accident: The patient is already taking aspirin at home. I will start the patient on Plavix for now along with aspirin for acute cerebrovascular accident. Will check lipid profile. Consider statins prior to discharge. Will also check carotid Doppler given history of stroke. Physical therapy to see patient.  5.  Elevated troponin: The patient has borderline elevated troponin. The patient will be continued on aspirin and p.r.n. nitroglycerin. Cardiology consultation with Dr. Kirke CorinArida has already been placed. We will also check echo of the heart.  6.  Deep vein thrombosis prophylaxis: Subcutaneous heparin.  7.  Further work-up according to the patient's clinical course. Hospital admission plan was discussed with the patient and the patient's wife. Case was also discussed Dr. Kirke CorinArida, who will see patient in consultation per family request.   CRITICAL TIME SPENT: 50 minutes.    ____________________________ Wylie HailSona A. Allena KatzPatel, MD sap:jcm D: 08/06/2013 15:59:00 ET T: 08/06/2013 17:30:20 ET JOB#: [id number]  cc: Gerren Hoffmeier A. Allena KatzPatel, MD, <Dictator> Courtney ParisElizabeth B. Cliffton AstersWhite, FNP  Willow Ora MD ELECTRONICALLY SIGNED 08/25/2013 10:25

## 2015-02-07 IMAGING — CT CT ABD-PELV W/O CM
2 of 4 series · 16 of 46 positions shown, 18 images · non-contrast
Comparison: None

CLINICAL DATA: Urinary retention

EXAM:
CT ABDOMEN AND PELVIS WITHOUT CONTRAST
TECHNIQUE: Multidetector CT imaging of the abdomen and pelvis was performed
following the standard protocol without IV contrast.

[Series 2: routine abd pel without · axial · non-contrast · 0.70mm/px · z∈[-842,-422]mm · 13 of 92 slices shown, 15 images]
[im 4/92  soft-tissue]
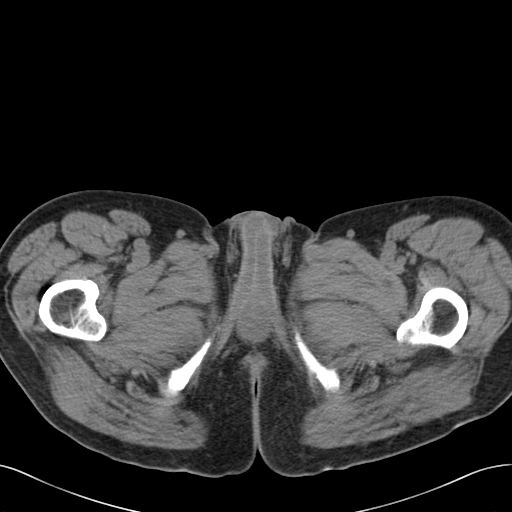
[im 4/92  bone]
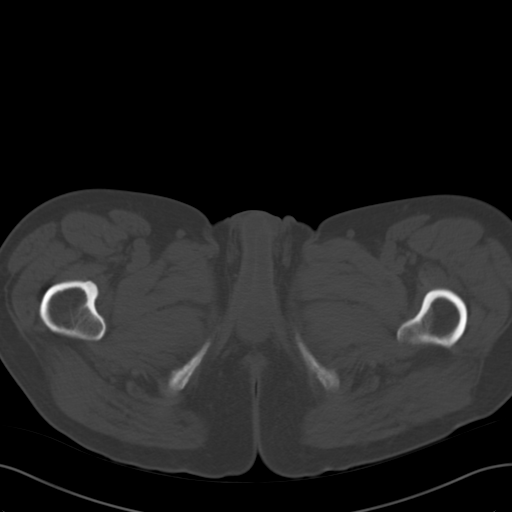
[im 11/92  soft-tissue]
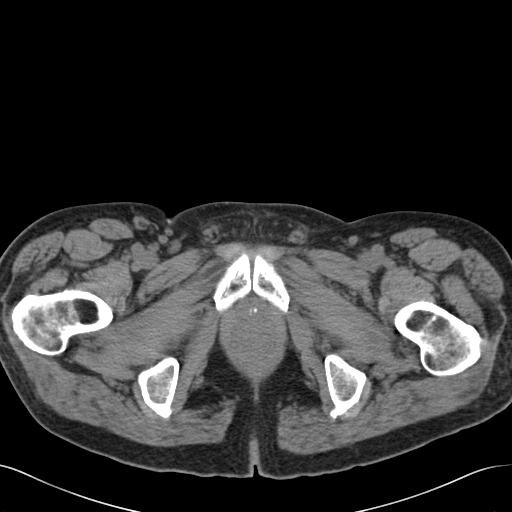
[im 18/92  soft-tissue]
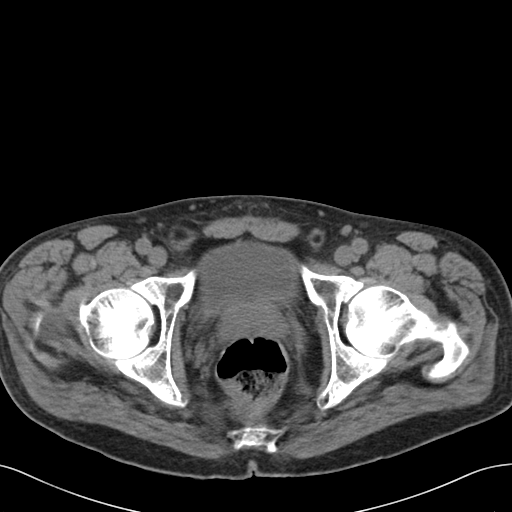
[im 25/92  soft-tissue]
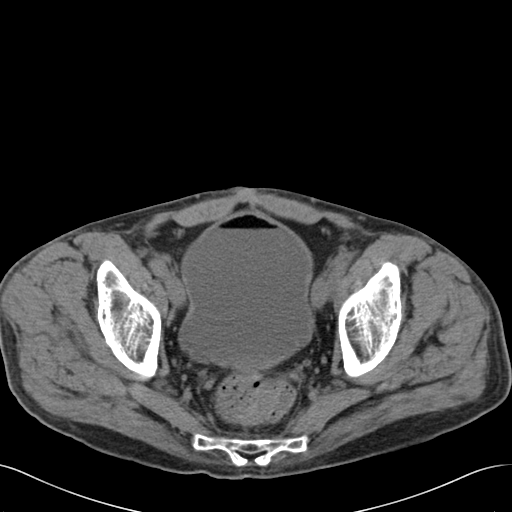
[im 32/92  soft-tissue]
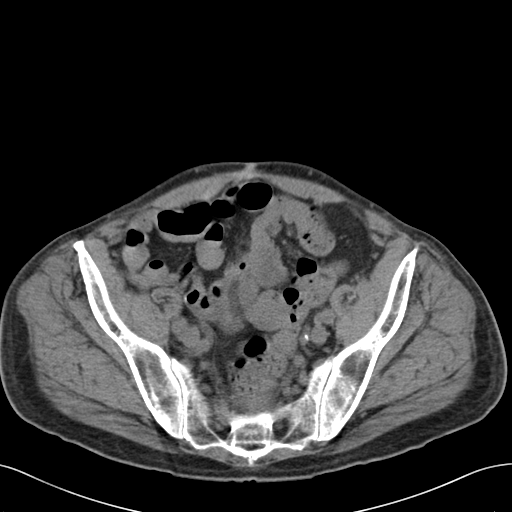
[im 39/92  soft-tissue]
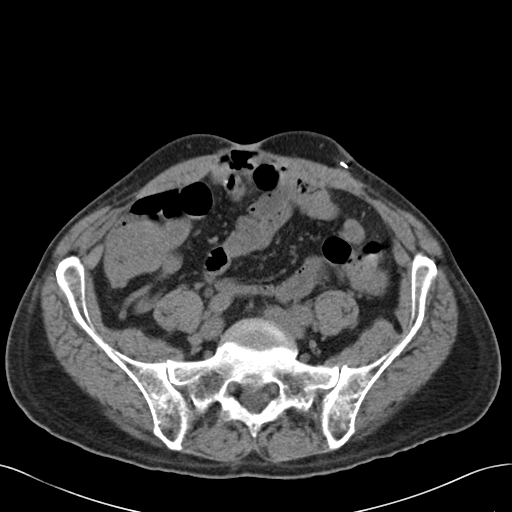
[im 46/92  soft-tissue]
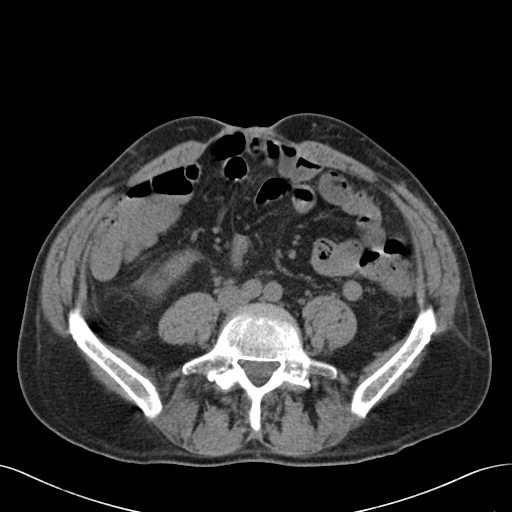
[im 53/92  soft-tissue]
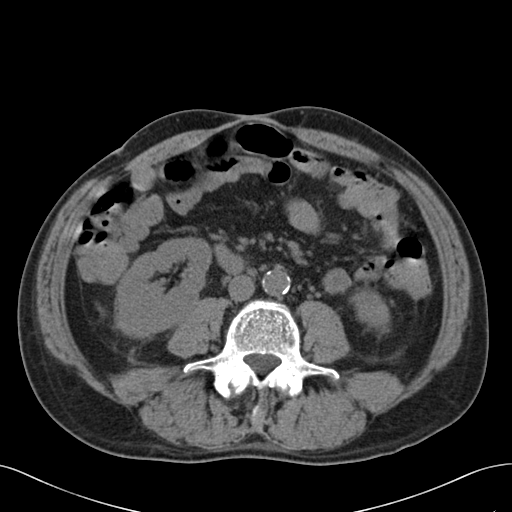
[im 60/92  soft-tissue]
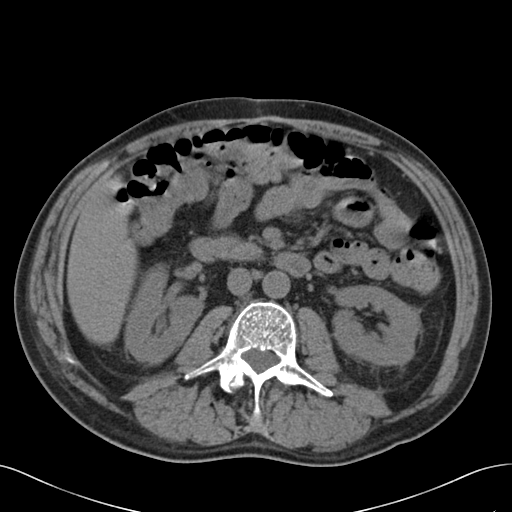
[im 60/92  bone]
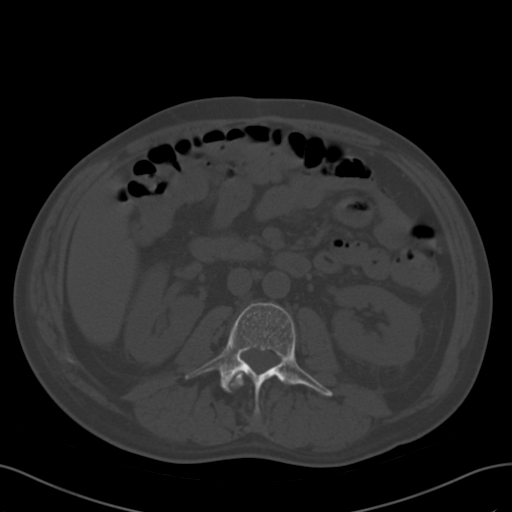
[im 67/92  soft-tissue]
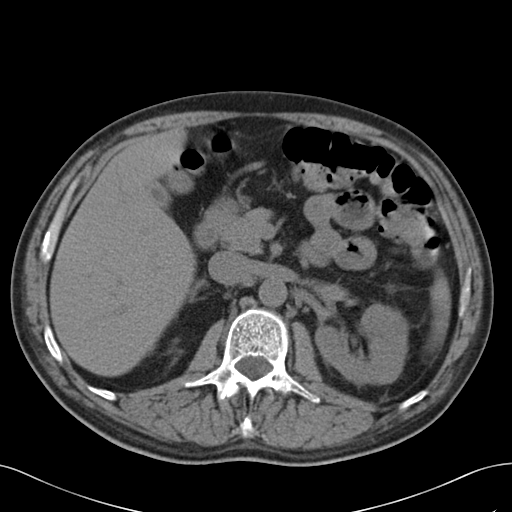
[im 74/92  soft-tissue]
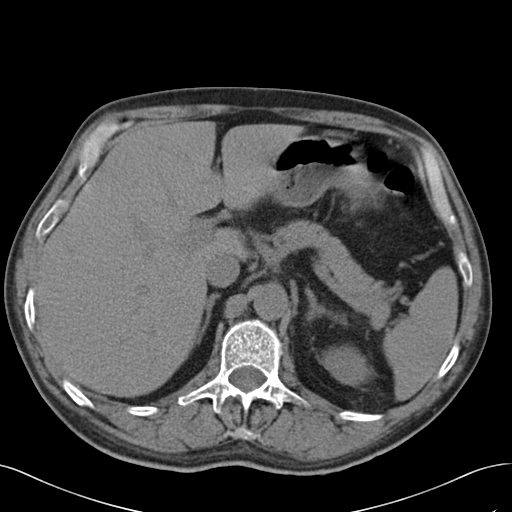
[im 81/92  soft-tissue]
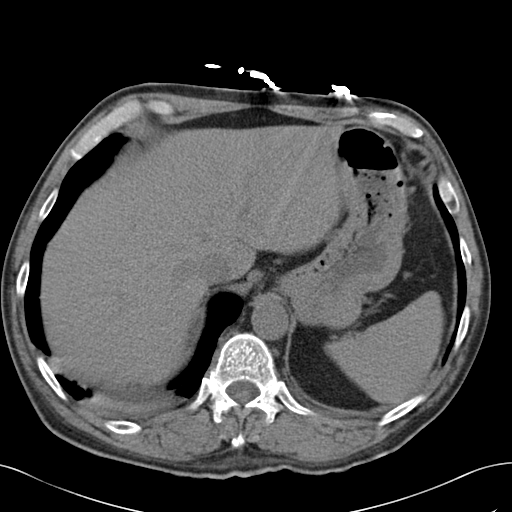
[im 88/92  soft-tissue]
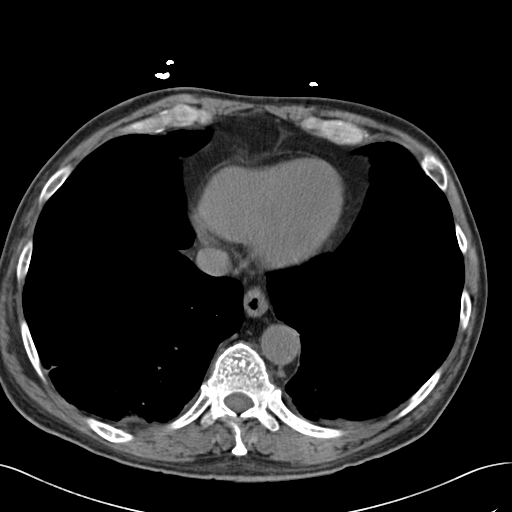

[Series 5: cor routine abd pel wo · coronal · 0.69mm/px · 3 of 135 slices shown]
[im 45/135  soft-tissue]
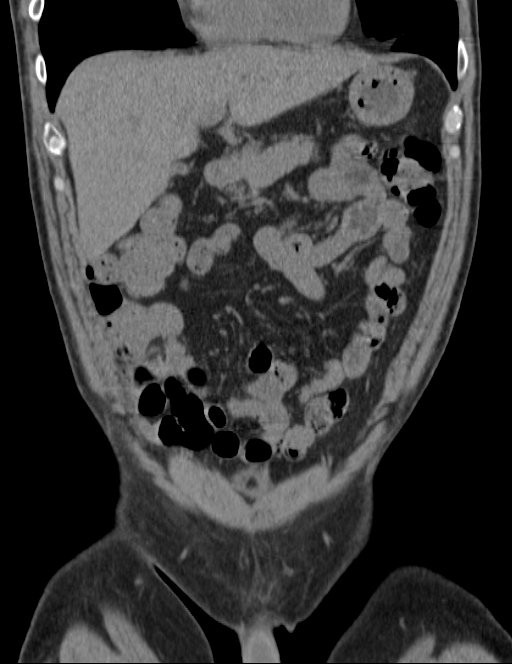
[im 60/135  soft-tissue]
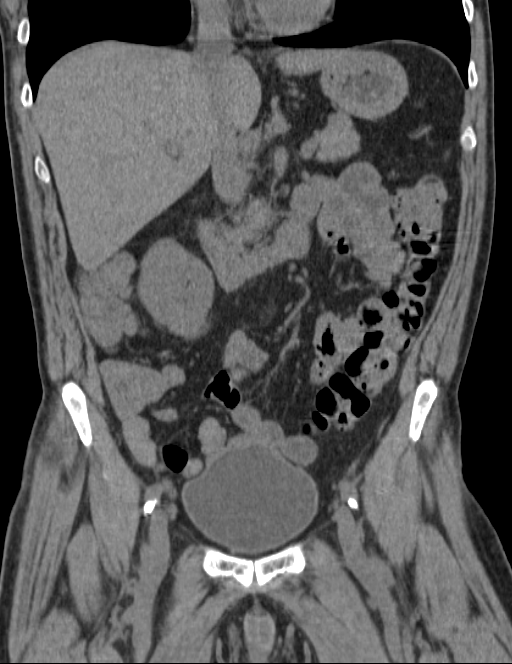
[im 75/135  soft-tissue]
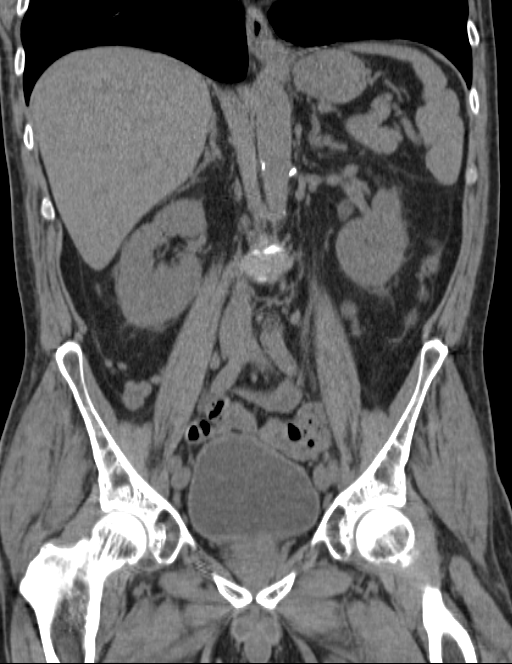

[16 of 46 positions shown; findings below may reference images not displayed]

FINDINGS: Dependent changes are identified within both lung bases, right
greater than left. No pleural or pericardial effusion noted.

No focal liver abnormality. The gallbladder is normal. No biliary
dilatation. The pancreas and spleen are on unremarkable.

The adrenal glands both appear normal. There is mild, bilateral very
nephric fat stranding which is nonspecific. No nephrolithiasis or
hydronephrosis identified. The urinary bladder appears normal. There
is a small amount of gas within the lumen of the ladder which may be
related instrumentation. The prostate gland and seminal vesicles are
unremarkable.

Calcified atherosclerotic disease involves the abdominal aorta.
There is no upper abdominal adenopathy. No pelvic or inguinal
adenopathy identified.

The stomach is normal. The small bowel loops have a normal course
and caliber. There is no obstruction. The appendix is visualized and
appears normal. Normal appearance of the colon.

Review of the visualized osseous structures is significant for mild
spondylosis within the lumbar spine.
IMPRESSION: 1. No acute findings within the abdomen or pelvis. No
nephrolithiasis or obstructive uropathy identified.
2. Mild bilateral perinephric fat stranding. This is a nonspecific
finding and may be seen with pyelonephritis as well as chronic renal
insufficiency.

## 2016-01-01 ENCOUNTER — Ambulatory Visit
Admission: RE | Admit: 2016-01-01 | Discharge: 2016-01-01 | Disposition: A | Discharge status: Home / Self Care | Payer: BLUE CROSS/BLUE SHIELD | Source: Ambulatory Visit | Attending: Pediatrics | Admitting: Pediatrics

## 2016-01-01 ENCOUNTER — Other Ambulatory Visit: Payer: Self-pay | Admitting: Pediatrics

## 2016-01-01 DIAGNOSIS — Z952 Presence of prosthetic heart valve: Secondary | ICD-10-CM

## 2016-01-01 DIAGNOSIS — Z7901 Long term (current) use of anticoagulants: Secondary | ICD-10-CM | POA: Diagnosis not present

## 2016-01-01 DIAGNOSIS — M79604 Pain in right leg: Secondary | ICD-10-CM

## 2017-03-07 IMAGING — US US EXTREM LOW VENOUS*R*
1 series · 14 of 24 positions shown · non-contrast
Comparison: None.

CLINICAL DATA: Right leg pain. Status post mitral valve
replacement.

EXAM:
RIGHT LOWER EXTREMITY VENOUS DOPPLER ULTRASOUND
TECHNIQUE: Gray-scale sonography with graded compression, as well as color
Doppler and duplex ultrasound, were performed to evaluate the deep
venous system from the level of the common femoral vein through the
popliteal and proximal calf veins. Spectral Doppler was utilized to
evaluate flow at rest and with distal augmentation maneuvers.

[Series 1: us extrem low venous*right* · 0.07mm/px · 14 of 34 slices shown]
[im 1/34]
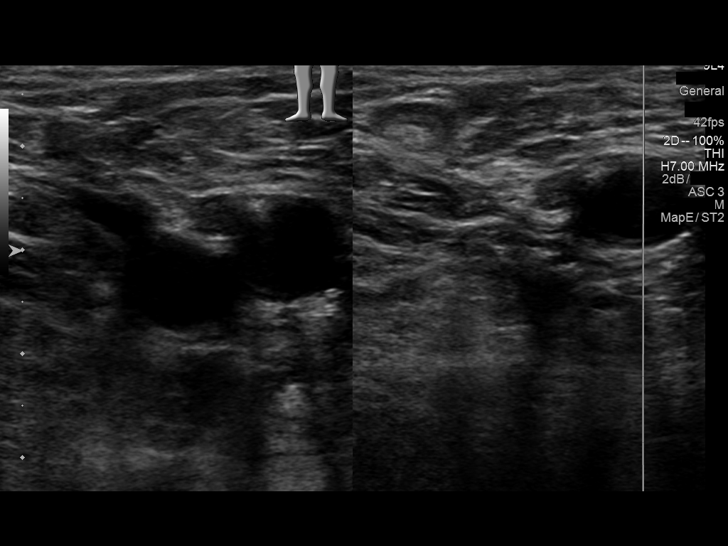
[im 3/34]
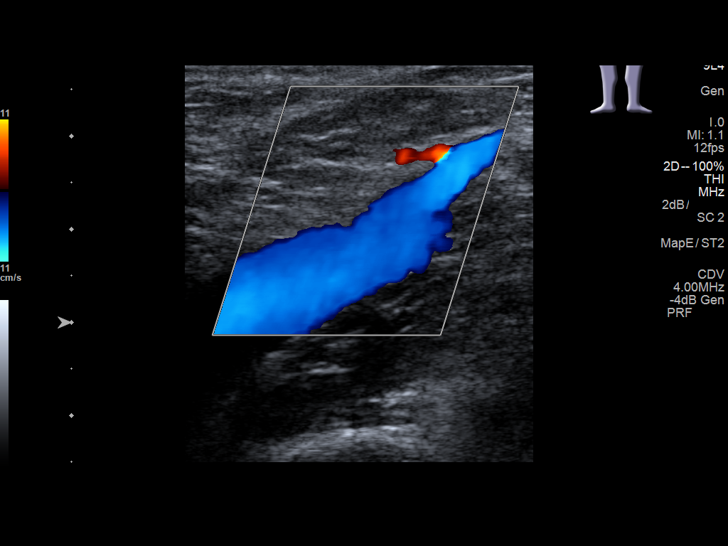
[im 6/34]
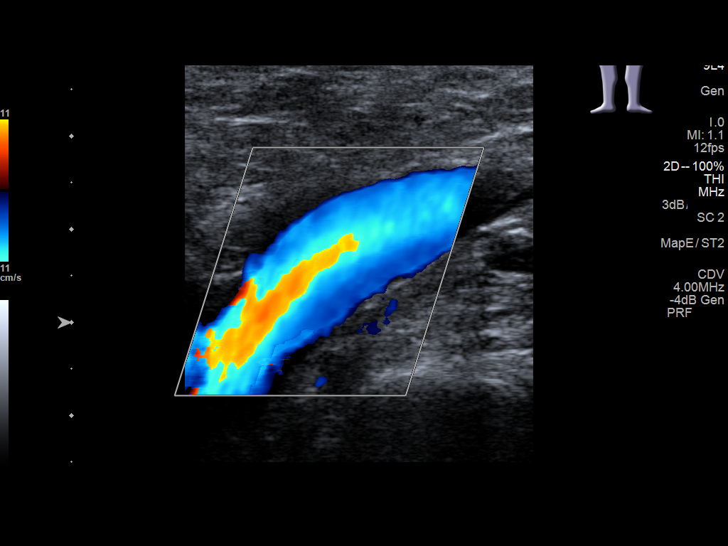
[im 9/34]
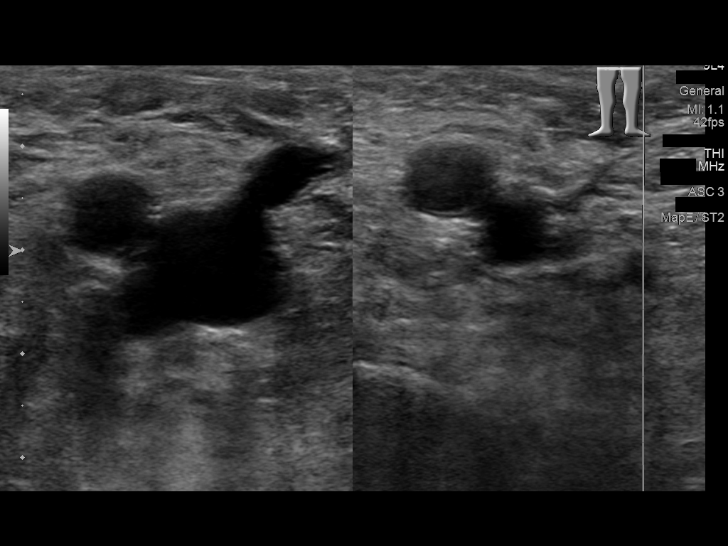
[im 11/34]
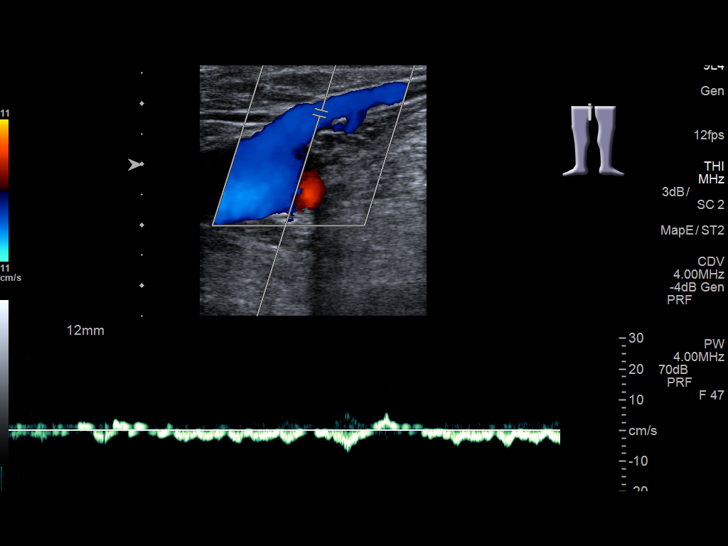
[im 13/34]
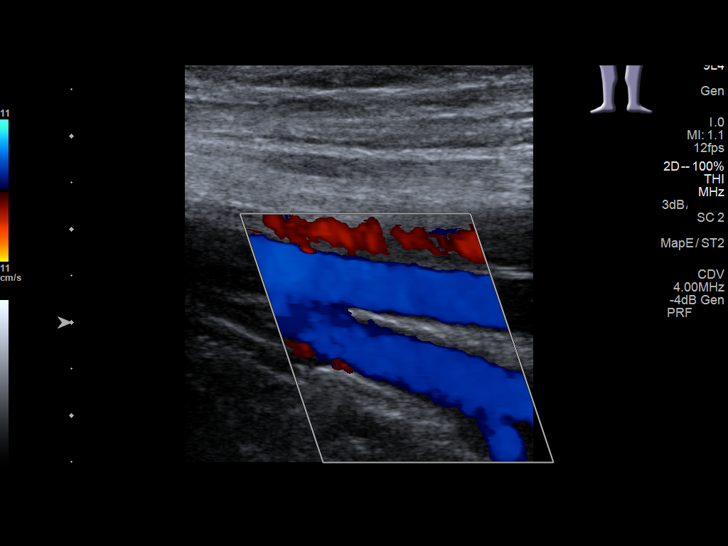
[im 16/34]
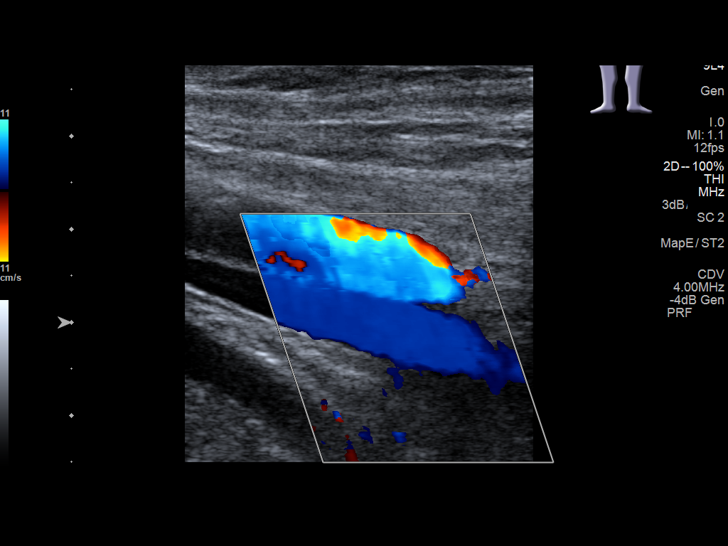
[im 18/34]
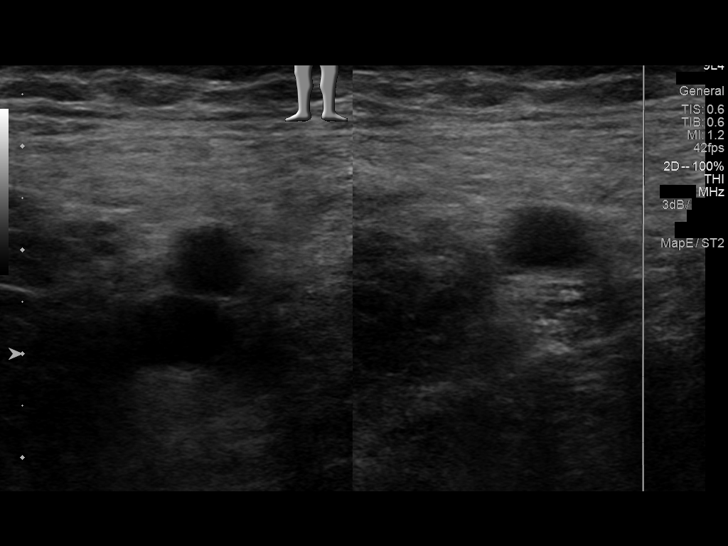
[im 21/34]
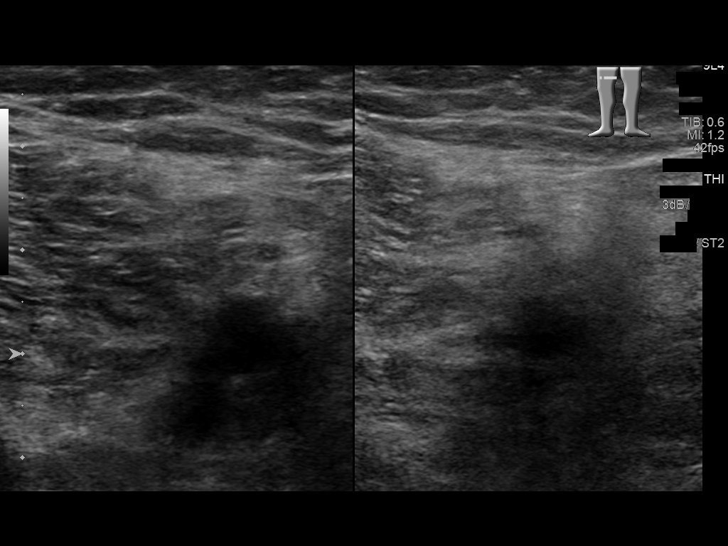
[im 23/34]
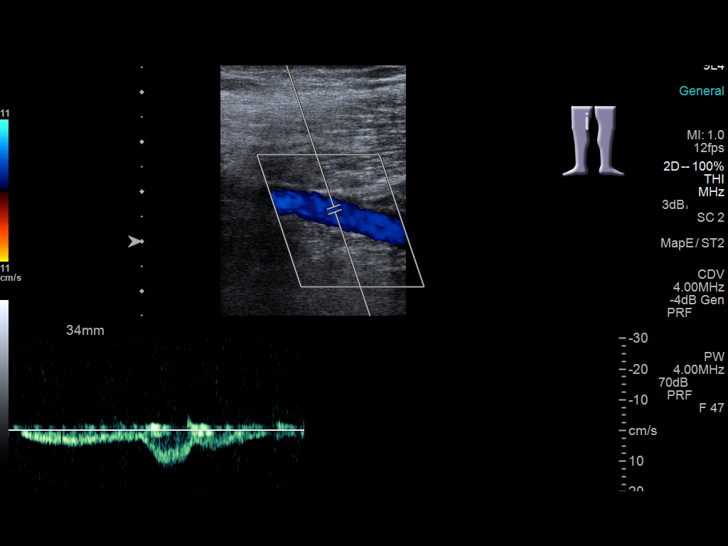
[im 26/34]
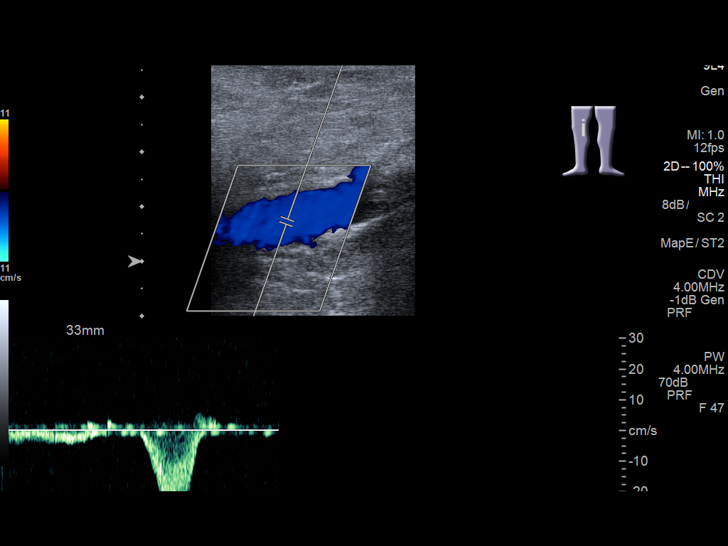
[im 28/34]
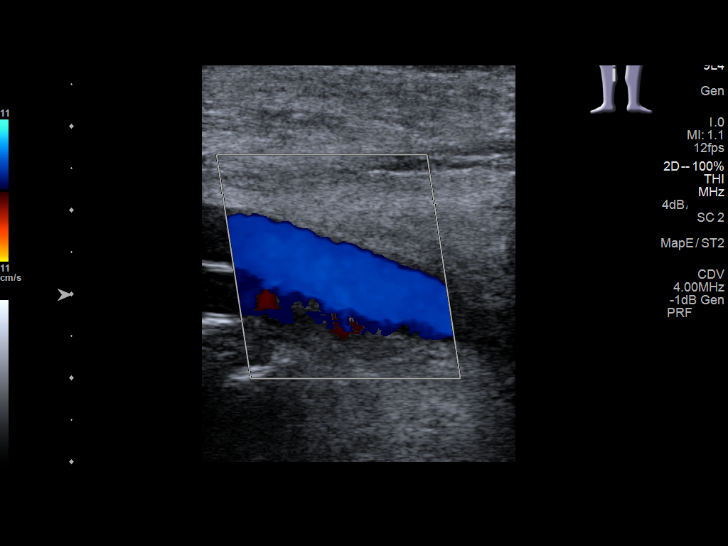
[im 31/34]
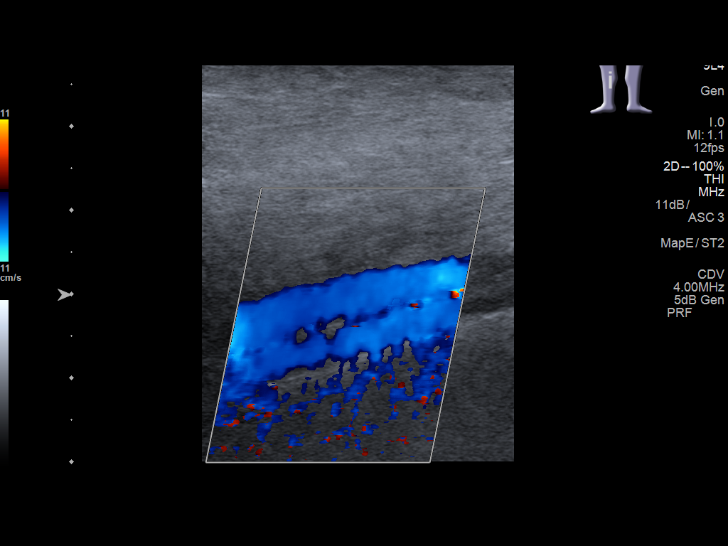
[im 34/34]
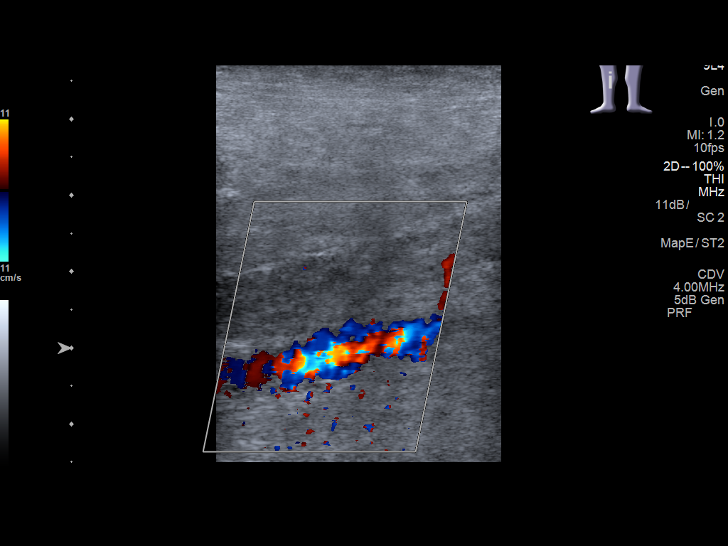

[14 of 24 positions shown; findings below may reference images not displayed]

FINDINGS: Left common femoral vein is patent without thrombus.

Normal compressibility, augmentation and color Doppler flow in the
right common femoral vein, right femoral vein and right popliteal
vein. The right saphenofemoral junction is patent. Right profunda
femoral vein is patent without thrombus. Visualized right deep calf
veins are patent without thrombus.
IMPRESSION: Negative for deep venous thrombosis in right lower extremity.
